# Patient Record
Sex: Male | Born: 1970 | Race: Black or African American | Hispanic: No | Marital: Married | Smoking: Never smoker
Health system: Southern US, Community
[De-identification: ages and names within clinical notes are randomized; demographics above are authoritative.]

---

## 1993-06-19 HISTORY — PX: ANKLE ARTHROPLASTY: SUR68

## 2015-12-30 DIAGNOSIS — L209 Atopic dermatitis, unspecified: Secondary | ICD-10-CM | POA: Diagnosis not present

## 2015-12-30 DIAGNOSIS — T7849XA Other allergy, initial encounter: Secondary | ICD-10-CM | POA: Diagnosis not present

## 2015-12-30 DIAGNOSIS — L509 Urticaria, unspecified: Secondary | ICD-10-CM | POA: Diagnosis not present

## 2016-01-27 DIAGNOSIS — L509 Urticaria, unspecified: Secondary | ICD-10-CM | POA: Diagnosis not present

## 2016-01-27 DIAGNOSIS — T7849XA Other allergy, initial encounter: Secondary | ICD-10-CM | POA: Diagnosis not present

## 2016-01-27 DIAGNOSIS — L209 Atopic dermatitis, unspecified: Secondary | ICD-10-CM | POA: Diagnosis not present

## 2016-02-04 DIAGNOSIS — R21 Rash and other nonspecific skin eruption: Secondary | ICD-10-CM | POA: Diagnosis not present

## 2016-02-04 DIAGNOSIS — L509 Urticaria, unspecified: Secondary | ICD-10-CM | POA: Diagnosis not present

## 2016-02-15 DIAGNOSIS — H40013 Open angle with borderline findings, low risk, bilateral: Secondary | ICD-10-CM | POA: Diagnosis not present

## 2016-02-16 DIAGNOSIS — J3081 Allergic rhinitis due to animal (cat) (dog) hair and dander: Secondary | ICD-10-CM | POA: Diagnosis not present

## 2016-02-16 DIAGNOSIS — J3089 Other allergic rhinitis: Secondary | ICD-10-CM | POA: Diagnosis not present

## 2016-02-16 DIAGNOSIS — L2089 Other atopic dermatitis: Secondary | ICD-10-CM | POA: Diagnosis not present

## 2016-02-16 DIAGNOSIS — J301 Allergic rhinitis due to pollen: Secondary | ICD-10-CM | POA: Diagnosis not present

## 2016-03-08 DIAGNOSIS — H401212 Low-tension glaucoma, right eye, moderate stage: Secondary | ICD-10-CM | POA: Diagnosis not present

## 2016-03-08 DIAGNOSIS — H401221 Low-tension glaucoma, left eye, mild stage: Secondary | ICD-10-CM | POA: Diagnosis not present

## 2016-04-15 DIAGNOSIS — Z23 Encounter for immunization: Secondary | ICD-10-CM | POA: Diagnosis not present

## 2016-06-30 DIAGNOSIS — Z0000 Encounter for general adult medical examination without abnormal findings: Secondary | ICD-10-CM | POA: Diagnosis not present

## 2016-06-30 DIAGNOSIS — Z125 Encounter for screening for malignant neoplasm of prostate: Secondary | ICD-10-CM | POA: Diagnosis not present

## 2016-06-30 DIAGNOSIS — E559 Vitamin D deficiency, unspecified: Secondary | ICD-10-CM | POA: Diagnosis not present

## 2016-06-30 DIAGNOSIS — E784 Other hyperlipidemia: Secondary | ICD-10-CM | POA: Diagnosis not present

## 2016-07-21 DIAGNOSIS — Z1389 Encounter for screening for other disorder: Secondary | ICD-10-CM | POA: Diagnosis not present

## 2016-07-21 DIAGNOSIS — Z0000 Encounter for general adult medical examination without abnormal findings: Secondary | ICD-10-CM | POA: Diagnosis not present

## 2016-07-21 DIAGNOSIS — E559 Vitamin D deficiency, unspecified: Secondary | ICD-10-CM | POA: Diagnosis not present

## 2016-07-21 DIAGNOSIS — D72819 Decreased white blood cell count, unspecified: Secondary | ICD-10-CM | POA: Diagnosis not present

## 2016-07-21 DIAGNOSIS — K648 Other hemorrhoids: Secondary | ICD-10-CM | POA: Diagnosis not present

## 2016-07-21 DIAGNOSIS — R21 Rash and other nonspecific skin eruption: Secondary | ICD-10-CM | POA: Diagnosis not present

## 2016-09-18 DIAGNOSIS — H401221 Low-tension glaucoma, left eye, mild stage: Secondary | ICD-10-CM | POA: Diagnosis not present

## 2016-09-18 DIAGNOSIS — H401212 Low-tension glaucoma, right eye, moderate stage: Secondary | ICD-10-CM | POA: Diagnosis not present

## 2016-10-10 DIAGNOSIS — H401212 Low-tension glaucoma, right eye, moderate stage: Secondary | ICD-10-CM | POA: Diagnosis not present

## 2016-10-10 DIAGNOSIS — H401221 Low-tension glaucoma, left eye, mild stage: Secondary | ICD-10-CM | POA: Diagnosis not present

## 2016-11-06 DIAGNOSIS — H4720 Unspecified optic atrophy: Secondary | ICD-10-CM | POA: Diagnosis not present

## 2016-11-28 DIAGNOSIS — H401212 Low-tension glaucoma, right eye, moderate stage: Secondary | ICD-10-CM | POA: Diagnosis not present

## 2017-02-26 DIAGNOSIS — H401212 Low-tension glaucoma, right eye, moderate stage: Secondary | ICD-10-CM | POA: Diagnosis not present

## 2017-02-26 DIAGNOSIS — H401221 Low-tension glaucoma, left eye, mild stage: Secondary | ICD-10-CM | POA: Diagnosis not present

## 2017-02-26 DIAGNOSIS — H10022 Other mucopurulent conjunctivitis, left eye: Secondary | ICD-10-CM | POA: Diagnosis not present

## 2017-03-17 DIAGNOSIS — Z23 Encounter for immunization: Secondary | ICD-10-CM | POA: Diagnosis not present

## 2017-04-03 DIAGNOSIS — H401221 Low-tension glaucoma, left eye, mild stage: Secondary | ICD-10-CM | POA: Diagnosis not present

## 2017-04-03 DIAGNOSIS — H401212 Low-tension glaucoma, right eye, moderate stage: Secondary | ICD-10-CM | POA: Diagnosis not present

## 2017-06-14 DIAGNOSIS — R600 Localized edema: Secondary | ICD-10-CM | POA: Diagnosis not present

## 2017-06-14 DIAGNOSIS — M79605 Pain in left leg: Secondary | ICD-10-CM | POA: Diagnosis not present

## 2017-06-14 DIAGNOSIS — Z6828 Body mass index (BMI) 28.0-28.9, adult: Secondary | ICD-10-CM | POA: Diagnosis not present

## 2017-06-20 ENCOUNTER — Ambulatory Visit (HOSPITAL_COMMUNITY)
Admission: RE | Admit: 2017-06-20 | Discharge: 2017-06-20 | Disposition: A | Discharge status: Home / Self Care | Payer: BLUE CROSS/BLUE SHIELD | Source: Ambulatory Visit | Attending: Vascular Surgery | Admitting: Vascular Surgery

## 2017-06-20 ENCOUNTER — Other Ambulatory Visit (HOSPITAL_COMMUNITY): Payer: Self-pay | Admitting: Internal Medicine

## 2017-06-20 DIAGNOSIS — M79605 Pain in left leg: Secondary | ICD-10-CM | POA: Diagnosis not present

## 2017-07-23 DIAGNOSIS — E559 Vitamin D deficiency, unspecified: Secondary | ICD-10-CM | POA: Diagnosis not present

## 2017-07-23 DIAGNOSIS — R82998 Other abnormal findings in urine: Secondary | ICD-10-CM | POA: Diagnosis not present

## 2017-07-23 DIAGNOSIS — Z125 Encounter for screening for malignant neoplasm of prostate: Secondary | ICD-10-CM | POA: Diagnosis not present

## 2017-07-23 DIAGNOSIS — E7849 Other hyperlipidemia: Secondary | ICD-10-CM | POA: Diagnosis not present

## 2017-07-24 DIAGNOSIS — H401221 Low-tension glaucoma, left eye, mild stage: Secondary | ICD-10-CM | POA: Diagnosis not present

## 2017-07-24 DIAGNOSIS — H10022 Other mucopurulent conjunctivitis, left eye: Secondary | ICD-10-CM | POA: Diagnosis not present

## 2017-07-24 DIAGNOSIS — H401212 Low-tension glaucoma, right eye, moderate stage: Secondary | ICD-10-CM | POA: Diagnosis not present

## 2017-07-27 DIAGNOSIS — R21 Rash and other nonspecific skin eruption: Secondary | ICD-10-CM | POA: Diagnosis not present

## 2017-07-27 DIAGNOSIS — Z0000 Encounter for general adult medical examination without abnormal findings: Secondary | ICD-10-CM | POA: Diagnosis not present

## 2017-07-27 DIAGNOSIS — J302 Other seasonal allergic rhinitis: Secondary | ICD-10-CM | POA: Diagnosis not present

## 2017-07-27 DIAGNOSIS — Z1331 Encounter for screening for depression: Secondary | ICD-10-CM | POA: Diagnosis not present

## 2017-07-27 DIAGNOSIS — R7309 Other abnormal glucose: Secondary | ICD-10-CM | POA: Diagnosis not present

## 2017-07-27 DIAGNOSIS — E7849 Other hyperlipidemia: Secondary | ICD-10-CM | POA: Diagnosis not present

## 2017-07-30 ENCOUNTER — Other Ambulatory Visit: Payer: Self-pay | Admitting: Internal Medicine

## 2017-07-30 DIAGNOSIS — E785 Hyperlipidemia, unspecified: Secondary | ICD-10-CM

## 2017-08-15 ENCOUNTER — Ambulatory Visit
Admission: RE | Admit: 2017-08-15 | Discharge: 2017-08-15 | Disposition: A | Discharge status: Home / Self Care | Payer: BLUE CROSS/BLUE SHIELD | Source: Ambulatory Visit | Attending: Internal Medicine | Admitting: Internal Medicine

## 2017-08-15 DIAGNOSIS — E785 Hyperlipidemia, unspecified: Secondary | ICD-10-CM

## 2017-08-30 DIAGNOSIS — Z1159 Encounter for screening for other viral diseases: Secondary | ICD-10-CM | POA: Diagnosis not present

## 2017-11-19 DIAGNOSIS — M9903 Segmental and somatic dysfunction of lumbar region: Secondary | ICD-10-CM | POA: Diagnosis not present

## 2017-11-19 DIAGNOSIS — S39012A Strain of muscle, fascia and tendon of lower back, initial encounter: Secondary | ICD-10-CM | POA: Diagnosis not present

## 2017-11-19 DIAGNOSIS — M62830 Muscle spasm of back: Secondary | ICD-10-CM | POA: Diagnosis not present

## 2017-12-19 DIAGNOSIS — H10022 Other mucopurulent conjunctivitis, left eye: Secondary | ICD-10-CM | POA: Diagnosis not present

## 2017-12-19 DIAGNOSIS — H401212 Low-tension glaucoma, right eye, moderate stage: Secondary | ICD-10-CM | POA: Diagnosis not present

## 2017-12-19 DIAGNOSIS — H401221 Low-tension glaucoma, left eye, mild stage: Secondary | ICD-10-CM | POA: Diagnosis not present

## 2017-12-21 DIAGNOSIS — M545 Low back pain: Secondary | ICD-10-CM | POA: Diagnosis not present

## 2018-02-15 ENCOUNTER — Other Ambulatory Visit: Payer: Self-pay | Admitting: Internal Medicine

## 2018-02-15 DIAGNOSIS — J9859 Other diseases of mediastinum, not elsewhere classified: Secondary | ICD-10-CM

## 2018-03-08 ENCOUNTER — Other Ambulatory Visit: Payer: BLUE CROSS/BLUE SHIELD

## 2018-03-11 DIAGNOSIS — M25561 Pain in right knee: Secondary | ICD-10-CM | POA: Diagnosis not present

## 2018-03-20 ENCOUNTER — Ambulatory Visit
Admission: RE | Admit: 2018-03-20 | Discharge: 2018-03-20 | Disposition: A | Discharge status: Home / Self Care | Payer: BLUE CROSS/BLUE SHIELD | Source: Ambulatory Visit | Attending: Internal Medicine | Admitting: Internal Medicine

## 2018-03-20 DIAGNOSIS — J9859 Other diseases of mediastinum, not elsewhere classified: Secondary | ICD-10-CM

## 2018-03-20 DIAGNOSIS — E320 Persistent hyperplasia of thymus: Secondary | ICD-10-CM | POA: Diagnosis not present

## 2018-03-26 DIAGNOSIS — R222 Localized swelling, mass and lump, trunk: Secondary | ICD-10-CM | POA: Diagnosis not present

## 2018-03-26 DIAGNOSIS — Z23 Encounter for immunization: Secondary | ICD-10-CM | POA: Diagnosis not present

## 2018-03-26 DIAGNOSIS — Z111 Encounter for screening for respiratory tuberculosis: Secondary | ICD-10-CM | POA: Diagnosis not present

## 2018-03-26 DIAGNOSIS — J302 Other seasonal allergic rhinitis: Secondary | ICD-10-CM | POA: Diagnosis not present

## 2018-03-26 DIAGNOSIS — R7309 Other abnormal glucose: Secondary | ICD-10-CM | POA: Diagnosis not present

## 2018-03-26 DIAGNOSIS — E7849 Other hyperlipidemia: Secondary | ICD-10-CM | POA: Diagnosis not present

## 2018-04-23 DIAGNOSIS — H401221 Low-tension glaucoma, left eye, mild stage: Secondary | ICD-10-CM | POA: Diagnosis not present

## 2018-04-23 DIAGNOSIS — H401212 Low-tension glaucoma, right eye, moderate stage: Secondary | ICD-10-CM | POA: Diagnosis not present

## 2018-05-07 DIAGNOSIS — H401212 Low-tension glaucoma, right eye, moderate stage: Secondary | ICD-10-CM | POA: Diagnosis not present

## 2018-05-07 DIAGNOSIS — H401221 Low-tension glaucoma, left eye, mild stage: Secondary | ICD-10-CM | POA: Diagnosis not present

## 2018-05-07 DIAGNOSIS — H10022 Other mucopurulent conjunctivitis, left eye: Secondary | ICD-10-CM | POA: Diagnosis not present

## 2018-05-23 DIAGNOSIS — H40033 Anatomical narrow angle, bilateral: Secondary | ICD-10-CM | POA: Diagnosis not present

## 2018-06-25 DIAGNOSIS — H401221 Low-tension glaucoma, left eye, mild stage: Secondary | ICD-10-CM | POA: Diagnosis not present

## 2018-06-25 DIAGNOSIS — H401212 Low-tension glaucoma, right eye, moderate stage: Secondary | ICD-10-CM | POA: Diagnosis not present

## 2018-06-25 DIAGNOSIS — H10022 Other mucopurulent conjunctivitis, left eye: Secondary | ICD-10-CM | POA: Diagnosis not present

## 2018-07-19 DIAGNOSIS — Z1211 Encounter for screening for malignant neoplasm of colon: Secondary | ICD-10-CM | POA: Diagnosis not present

## 2018-07-19 DIAGNOSIS — K640 First degree hemorrhoids: Secondary | ICD-10-CM | POA: Diagnosis not present

## 2018-07-19 DIAGNOSIS — D125 Benign neoplasm of sigmoid colon: Secondary | ICD-10-CM | POA: Diagnosis not present

## 2018-07-23 DIAGNOSIS — Z1211 Encounter for screening for malignant neoplasm of colon: Secondary | ICD-10-CM | POA: Diagnosis not present

## 2018-07-23 DIAGNOSIS — D125 Benign neoplasm of sigmoid colon: Secondary | ICD-10-CM | POA: Diagnosis not present

## 2018-08-06 DIAGNOSIS — H401212 Low-tension glaucoma, right eye, moderate stage: Secondary | ICD-10-CM | POA: Diagnosis not present

## 2018-08-06 DIAGNOSIS — H401221 Low-tension glaucoma, left eye, mild stage: Secondary | ICD-10-CM | POA: Diagnosis not present

## 2018-10-15 DIAGNOSIS — R222 Localized swelling, mass and lump, trunk: Secondary | ICD-10-CM | POA: Diagnosis not present

## 2018-10-15 DIAGNOSIS — H40009 Preglaucoma, unspecified, unspecified eye: Secondary | ICD-10-CM | POA: Diagnosis not present

## 2018-10-15 DIAGNOSIS — Z0000 Encounter for general adult medical examination without abnormal findings: Secondary | ICD-10-CM | POA: Diagnosis not present

## 2018-10-15 DIAGNOSIS — K635 Polyp of colon: Secondary | ICD-10-CM | POA: Diagnosis not present

## 2018-10-15 DIAGNOSIS — R739 Hyperglycemia, unspecified: Secondary | ICD-10-CM | POA: Diagnosis not present

## 2018-10-22 DIAGNOSIS — Z125 Encounter for screening for malignant neoplasm of prostate: Secondary | ICD-10-CM | POA: Diagnosis not present

## 2018-10-22 DIAGNOSIS — R739 Hyperglycemia, unspecified: Secondary | ICD-10-CM | POA: Diagnosis not present

## 2018-10-22 DIAGNOSIS — E7849 Other hyperlipidemia: Secondary | ICD-10-CM | POA: Diagnosis not present

## 2018-10-22 DIAGNOSIS — E559 Vitamin D deficiency, unspecified: Secondary | ICD-10-CM | POA: Diagnosis not present

## 2018-11-22 DIAGNOSIS — H401232 Low-tension glaucoma, bilateral, moderate stage: Secondary | ICD-10-CM | POA: Diagnosis not present

## 2018-12-17 DIAGNOSIS — H401212 Low-tension glaucoma, right eye, moderate stage: Secondary | ICD-10-CM | POA: Diagnosis not present

## 2018-12-17 DIAGNOSIS — H401221 Low-tension glaucoma, left eye, mild stage: Secondary | ICD-10-CM | POA: Diagnosis not present

## 2018-12-19 DIAGNOSIS — H401213 Low-tension glaucoma, right eye, severe stage: Secondary | ICD-10-CM | POA: Diagnosis not present

## 2018-12-19 DIAGNOSIS — H401222 Low-tension glaucoma, left eye, moderate stage: Secondary | ICD-10-CM | POA: Diagnosis not present

## 2019-01-01 ENCOUNTER — Other Ambulatory Visit: Payer: Self-pay | Admitting: Optometry

## 2019-01-01 DIAGNOSIS — H47213 Primary optic atrophy, bilateral: Secondary | ICD-10-CM

## 2019-01-20 ENCOUNTER — Other Ambulatory Visit: Payer: Self-pay

## 2019-01-20 DIAGNOSIS — Z20822 Contact with and (suspected) exposure to covid-19: Secondary | ICD-10-CM

## 2019-01-20 DIAGNOSIS — R6889 Other general symptoms and signs: Secondary | ICD-10-CM | POA: Diagnosis not present

## 2019-01-21 LAB — NOVEL CORONAVIRUS, NAA: SARS-CoV-2, NAA: NOT DETECTED

## 2019-01-23 DIAGNOSIS — Z03818 Encounter for observation for suspected exposure to other biological agents ruled out: Secondary | ICD-10-CM | POA: Diagnosis not present

## 2019-01-23 DIAGNOSIS — R05 Cough: Secondary | ICD-10-CM | POA: Diagnosis not present

## 2019-01-27 ENCOUNTER — Ambulatory Visit
Admission: RE | Admit: 2019-01-27 | Discharge: 2019-01-27 | Disposition: A | Discharge status: Home / Self Care | Payer: BLUE CROSS/BLUE SHIELD | Source: Ambulatory Visit | Attending: Optometry | Admitting: Optometry

## 2019-01-27 ENCOUNTER — Other Ambulatory Visit: Payer: Self-pay

## 2019-01-27 DIAGNOSIS — H47213 Primary optic atrophy, bilateral: Secondary | ICD-10-CM

## 2019-01-27 DIAGNOSIS — H47293 Other optic atrophy, bilateral: Secondary | ICD-10-CM | POA: Diagnosis not present

## 2019-01-27 MED ORDER — GADOBENATE DIMEGLUMINE 529 MG/ML IV SOLN
14.0000 mL | Freq: Once | INTRAVENOUS | Status: AC | PRN
  Administered 2019-01-27: 14 mL via INTRAVENOUS

## 2019-02-14 DIAGNOSIS — Z23 Encounter for immunization: Secondary | ICD-10-CM | POA: Diagnosis not present

## 2019-02-25 ENCOUNTER — Other Ambulatory Visit: Payer: Self-pay

## 2019-02-25 ENCOUNTER — Inpatient Hospital Stay (HOSPITAL_COMMUNITY)
Admission: EM | Admit: 2019-02-25 | Discharge: 2019-02-26 | DRG: 066 | Disposition: A | Discharge status: Home / Self Care | Payer: BC Managed Care – PPO | Attending: Neurosurgery | Admitting: Neurosurgery

## 2019-02-25 ENCOUNTER — Emergency Department (HOSPITAL_COMMUNITY): Payer: BC Managed Care – PPO

## 2019-02-25 ENCOUNTER — Encounter (HOSPITAL_COMMUNITY): Payer: Self-pay | Admitting: Emergency Medicine

## 2019-02-25 DIAGNOSIS — I619 Nontraumatic intracerebral hemorrhage, unspecified: Secondary | ICD-10-CM | POA: Diagnosis not present

## 2019-02-25 DIAGNOSIS — Z981 Arthrodesis status: Secondary | ICD-10-CM | POA: Diagnosis not present

## 2019-02-25 DIAGNOSIS — I615 Nontraumatic intracerebral hemorrhage, intraventricular: Secondary | ICD-10-CM | POA: Diagnosis not present

## 2019-02-25 DIAGNOSIS — Z20828 Contact with and (suspected) exposure to other viral communicable diseases: Secondary | ICD-10-CM | POA: Diagnosis present

## 2019-02-25 DIAGNOSIS — Z91040 Latex allergy status: Secondary | ICD-10-CM

## 2019-02-25 DIAGNOSIS — I629 Nontraumatic intracranial hemorrhage, unspecified: Secondary | ICD-10-CM | POA: Diagnosis not present

## 2019-02-25 LAB — CBC WITH DIFFERENTIAL/PLATELET
Abs Immature Granulocytes: 0.01 10*3/uL (ref 0.00–0.07)
Basophils Absolute: 0 10*3/uL (ref 0.0–0.1)
Basophils Relative: 1 %
Eosinophils Absolute: 0 10*3/uL (ref 0.0–0.5)
Eosinophils Relative: 0 %
HCT: 42.4 % (ref 39.0–52.0)
Hemoglobin: 14.5 g/dL (ref 13.0–17.0)
Immature Granulocytes: 0 %
Lymphocytes Relative: 46 %
Lymphs Abs: 1.8 10*3/uL (ref 0.7–4.0)
MCH: 30 pg (ref 26.0–34.0)
MCHC: 34.2 g/dL (ref 30.0–36.0)
MCV: 87.6 fL (ref 80.0–100.0)
Monocytes Absolute: 0.3 10*3/uL (ref 0.1–1.0)
Monocytes Relative: 9 %
Neutro Abs: 1.7 10*3/uL (ref 1.7–7.7)
Neutrophils Relative %: 44 %
Platelets: 250 10*3/uL (ref 150–400)
RBC: 4.84 MIL/uL (ref 4.22–5.81)
RDW: 11.9 % (ref 11.5–15.5)
WBC: 3.9 10*3/uL — ABNORMAL LOW (ref 4.0–10.5)
nRBC: 0 % (ref 0.0–0.2)

## 2019-02-25 LAB — PROTIME-INR
INR: 1 (ref 0.8–1.2)
Prothrombin Time: 13.4 seconds (ref 11.4–15.2)

## 2019-02-25 LAB — BASIC METABOLIC PANEL
Anion gap: 9 (ref 5–15)
BUN: 11 mg/dL (ref 6–20)
CO2: 25 mmol/L (ref 22–32)
Calcium: 8.9 mg/dL (ref 8.9–10.3)
Chloride: 105 mmol/L (ref 98–111)
Creatinine, Ser: 1 mg/dL (ref 0.61–1.24)
GFR calc Af Amer: >60 mL/min (ref >60)
GFR calc non Af Amer: >60 mL/min (ref >60)
Glucose, Bld: 111 mg/dL — ABNORMAL HIGH (ref 70–99)
Potassium: 4 mmol/L (ref 3.5–5.1)
Sodium: 139 mmol/L (ref 135–145)

## 2019-02-25 LAB — MRSA PCR SCREENING: MRSA by PCR: NEGATIVE

## 2019-02-25 LAB — SARS CORONAVIRUS 2 (TAT 6-24 HRS): SARS Coronavirus 2: NEGATIVE

## 2019-02-25 MED ORDER — LABETALOL HCL 5 MG/ML IV SOLN: 20.0000 mg | Freq: Once | INTRAVENOUS | Status: DC

## 2019-02-25 MED ORDER — SODIUM CHLORIDE 0.9 % IV SOLN
INTRAVENOUS | Status: DC
  Administered 2019-02-26: 01:00:00 via INTRAVENOUS

## 2019-02-25 MED ORDER — ACETAMINOPHEN 160 MG/5ML PO SOLN: 650.0000 mg | ORAL | Status: DC | PRN

## 2019-02-25 MED ORDER — CLEVIDIPINE BUTYRATE 0.5 MG/ML IV EMUL: 0.0000 mg/h | INTRAVENOUS | Status: DC

## 2019-02-25 MED ORDER — IOHEXOL 350 MG/ML SOLN
100.0000 mL | Freq: Once | INTRAVENOUS | Status: AC | PRN
  Administered 2019-02-25: 100 mL via INTRAVENOUS

## 2019-02-25 MED ORDER — STROKE: EARLY STAGES OF RECOVERY BOOK
Freq: Once | Status: AC
  Administered 2019-02-26: 07:00:00
  Filled 2019-02-25: qty 1

## 2019-02-25 MED ORDER — PANTOPRAZOLE SODIUM 40 MG IV SOLR
40.0000 mg | Freq: Every day | INTRAVENOUS | Status: DC
  Administered 2019-02-25: 40 mg via INTRAVENOUS
  Filled 2019-02-25: qty 40

## 2019-02-25 MED ORDER — SENNOSIDES-DOCUSATE SODIUM 8.6-50 MG PO TABS: 1.0000 | ORAL_TABLET | Freq: Two times a day (BID) | ORAL | Status: DC

## 2019-02-25 MED ORDER — ACETAMINOPHEN 325 MG PO TABS: 650.0000 mg | ORAL_TABLET | ORAL | Status: DC | PRN

## 2019-02-25 MED ORDER — BRIMONIDINE TARTRATE-TIMOLOL 0.2-0.5 % OP SOLN
1.0000 [drp] | Freq: Two times a day (BID) | OPHTHALMIC | Status: DC
  Filled 2019-02-25: qty 5

## 2019-02-25 MED ORDER — ACETAMINOPHEN 650 MG RE SUPP: 650.0000 mg | RECTAL | Status: DC | PRN

## 2019-02-25 MED ORDER — CHLORHEXIDINE GLUCONATE CLOTH 2 % EX PADS: 6.0000 | MEDICATED_PAD | Freq: Every day | CUTANEOUS | Status: DC

## 2019-02-25 MED ORDER — LATANOPROST 0.005 % OP SOLN
1.0000 [drp] | Freq: Every day | OPHTHALMIC | Status: DC
  Administered 2019-02-25: 23:00:00 1 [drp] via OPHTHALMIC
  Filled 2019-02-25: qty 2.5

## 2019-02-25 NOTE — H&P (Signed)
Thomas Reed is an 48 y.o. male.   Chief Complaint: Headache HPI: 48 year old male presents with sudden onset of headache.  Patient symptoms began while weightlifting earlier today.  No prior history of headache.  No prior history of severe headache.  Symptoms have rapidly improved.  Patient currently with minimal headache.  No other associated symptoms.  Patient denies numbness, paresthesias, weakness, cranial neuropathy, speech or language problem, or loss of consciousness.  No history of trauma.  History reviewed. No pertinent past medical history.  Past Surgical History:  Procedure Laterality Date  . ANKLE ARTHROPLASTY Left 1995    No family history on file. Social History:  reports that he has never smoked. He has never used smokeless tobacco. He reports current alcohol use. He reports that he does not use drugs.  Allergies:  Allergies  Allergen Reactions  . Latex Hives    (Not in a hospital admission)   Results for orders placed or performed during the hospital encounter of 02/25/19 (from the past 48 hour(s))  CBC with Differential     Status: Abnormal   Collection Time: 02/25/19  9:24 AM  Result Value Ref Range   WBC 3.9 (L) 4.0 - 10.5 K/uL   RBC 4.84 4.22 - 5.81 MIL/uL   Hemoglobin 14.5 13.0 - 17.0 g/dL   HCT 42.4 39.0 - 52.0 %   MCV 87.6 80.0 - 100.0 fL   MCH 30.0 26.0 - 34.0 pg   MCHC 34.2 30.0 - 36.0 g/dL   RDW 11.9 11.5 - 15.5 %   Platelets 250 150 - 400 K/uL   nRBC 0.0 0.0 - 0.2 %   Neutrophils Relative % 44 %   Neutro Abs 1.7 1.7 - 7.7 K/uL   Lymphocytes Relative 46 %   Lymphs Abs 1.8 0.7 - 4.0 K/uL   Monocytes Relative 9 %   Monocytes Absolute 0.3 0.1 - 1.0 K/uL   Eosinophils Relative 0 %   Eosinophils Absolute 0.0 0.0 - 0.5 K/uL   Basophils Relative 1 %   Basophils Absolute 0.0 0.0 - 0.1 K/uL   Immature Granulocytes 0 %   Abs Immature Granulocytes 0.01 0.00 - 0.07 K/uL    Comment: Performed at [facility], [address]  [place], [place] [address]  Basic metabolic panel     Status: Abnormal   Collection Time: 02/25/19  9:24 AM  Result Value Ref Range   Sodium 139 135 - 145 mmol/L   Potassium 4.0 3.5 - 5.1 mmol/L   Chloride 105 98 - 111 mmol/L   CO2 25 22 - 32 mmol/L   Glucose, Bld 111 (H) 70 - 99 mg/dL   BUN 11 6 - 20 mg/dL   Creatinine, Ser 1.00 0.61 - 1.24 mg/dL   Calcium 8.9 8.9 - 10.3 mg/dL   GFR calc non Af Amer >60 >60 mL/min   GFR calc Af Amer >60 >60 mL/min   Anion gap 9 5 - 15    Comment: Performed at [facility] [address] [place], [place] [address]  Protime-INR     Status: None   Collection Time: 02/25/19  9:24 AM  Result Value Ref Range   Prothrombin Time 13.4 11.4 - 15.2 seconds   INR 1.0 0.8 - 1.2    Comment: (NOTE) INR goal varies based on device and disease states. Performed at [facility], [place] [address] [place], [place] [address]    Ct Angio Head W Or Wo Contrast  Result  Date: 02/25/2019 CLINICAL DATA:  Sudden onset headache. EXAM: CT ANGIOGRAPHY HEAD TECHNIQUE: Multidetector CT imaging of the head was performed using the standard protocol during bolus administration of intravenous contrast. Multiplanar CT image reconstructions and MIPs were obtained to evaluate the vascular anatomy. CONTRAST:  75mL OMNIPAQUE IOHEXOL 350 MG/ML SOLN COMPARISON:  MRI head 01/27/2019 FINDINGS: CT HEAD Brain: Hyperdensity along the roof of the third ventricle consistent with acute hemorrhage. Relatively small hemorrhage volume. No other ventricular blood. No subarachnoid hip blood. Ventricle size normal. Negative for acute ischemic infarct. No mass or edema or midline shift. Vascular: Negative for hyperdense vessel Skull: Negative Sinuses: Air-fluid level in the maxillary sinus bilaterally with associated mucosal edema. Orbits: Negative CTA HEAD Anterior circulation: Cavernous carotid normal bilaterally without stenosis or aneurysm. Anterior and middle cerebral arteries patent bilaterally.  Posterior circulation: Both vertebral arteries patent to the basilar without stenosis. PICA patent bilaterally. Basilar widely patent. AICA, superior cerebellar, and posterior cerebral arteries are patent without stenosis. Negative for aneurysm. Venous sinuses: Negative for venous sinus thrombosis Hemorrhage in the roof of the third ventricle is in the region of the internal cerebral veins which appear patent bilaterally. Straight sinus patent. Anatomic variants: None IMPRESSION: Small volume acute intraventricular hemorrhage in the roof of the third ventricle. Suspect small vascular malformation however no aneurysm or AVM is identified. No evidence of venous sinus thrombosis. Colloid cyst can hemorrhage into the third ventricle however no colloid cyst identified. No hydrocephalus or acute infarct. These results were called by telephone at the time of interpretation on 02/25/2019 at 12:54 pm to provider [facility] , who verbally acknowledged these results. Electronically Signed   By: Marlan Palauharles  Clark M.D.   On: 02/25/2019 12:54    Pertinent items noted in HPI and remainder of comprehensive ROS otherwise negative.  Blood pressure (!) 128/91, pulse 65, temperature 97.7 F (36.5 C), temperature source Oral, resp. rate 16, height 5\' 4"  (1.626 m), weight 77.1 kg, SpO2 100 %.  Patient is awake and alert.  He is oriented and appropriate.  Speech is fluent.  His judgment and insight are intact.  Cranial nerve function normal bilaterally.  Motor examination 5/5 bilaterally.  No pronator drift.  Sensory exam intact.  Deep tendon is normal active.  No evidence of long track signs.  Initial head ears eyes nose throat is unremarkable.  Chest and abdomen are benign.  Extremities are free from injury or deformity. Assessment/Plan Small atypical third ventricular hemorrhage.  CT angiogram and MRI scan negative for vascular malformation or aneurysm.  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  Plan ICU  admission with observation.  Follow-up in CT scan in morning.  Sherilyn CooterHenry A Sparrow Sanzo 02/25/2019, 2:36 PM

## 2019-02-25 NOTE — ED Triage Notes (Signed)
Pt here for evaluation of headache onset while working out this morning. Pt has no pain on arrival, pain lasted for less than 15 mins.

## 2019-02-25 NOTE — ED Provider Notes (Signed)
[facility] EMERGENCY DEPARTMENT Provider Note   CSN: [id number] Arrival date & time: 02/25/19  0806     History   Chief Complaint Chief Complaint  Patient presents with  . Headache    HPI Thomas Reed is a 48 y.o. male.     The history is provided by the patient and medical records. No language interpreter was used.  Headache Pain location:  L parietal and L temporal Quality:  Dull Severity currently:  0/10 Severity at highest:  0/10 Onset quality:  Sudden Duration:  3 hours Timing:  Constant Progression:  Resolved Chronicity:  New Similar to prior headaches: no   Context: activity and straining   Worsened by:  Nothing Ineffective treatments:  None tried Associated symptoms: no abdominal pain, no back pain, no blurred vision, no congestion, no cough, no diarrhea, no dizziness, no fatigue, no fever, no focal weakness, no loss of balance, no myalgias, no nausea, no neck pain, no neck stiffness, no numbness, no paresthesias, no photophobia, no seizures, no sore throat, no tingling, no visual change, no vomiting and no weakness   Risk factors: family hx of SAH     History reviewed. No pertinent past medical history.  There are no active problems to display for this patient.   Past Surgical History:  Procedure Laterality Date  . ANKLE ARTHROPLASTY Left 1995        Home Medications    Prior to Admission medications   Not on File    Family History No family history on file.  Social History Social History   Tobacco Use  . Smoking status: Never Smoker  . Smokeless tobacco: Never Used  Substance Use Topics  . Alcohol use: Yes    Comment: social drinker  . Drug use: Never     Allergies   Patient has no known allergies.   Review of Systems Review of Systems  Constitutional: Negative for chills, diaphoresis, fatigue and fever.  HENT: Negative for congestion and sore throat.   Eyes: Negative for blurred vision, photophobia and  visual disturbance.  Respiratory: Negative for cough, chest tightness, shortness of breath, wheezing and stridor.   Cardiovascular: Negative for chest pain and palpitations.  Gastrointestinal: Negative for abdominal pain, diarrhea, nausea and vomiting.  Musculoskeletal: Negative for back pain, myalgias, neck pain and neck stiffness.  Neurological: Positive for headaches. Negative for dizziness, focal weakness, seizures, facial asymmetry, weakness, light-headedness, numbness, paresthesias and loss of balance.  Psychiatric/Behavioral: Negative for agitation and confusion.  All other systems reviewed and are negative.    Physical Exam Updated Vital Signs BP 130/86 (BP Location: Left Arm)   Pulse 71   Temp 97.7 F (36.5 C) (Oral)   Resp 16   Ht 5\' 4"  (1.626 m)   Wt 77.1 kg   SpO2 96%   BMI 29.18 kg/m   Physical Exam Vitals signs and nursing note reviewed.  Constitutional:      General: He is not in acute distress.    Appearance: He is well-developed. He is not ill-appearing, toxic-appearing or diaphoretic.  HENT:     Head: Normocephalic and atraumatic.     Mouth/Throat:     Mouth: Mucous membranes are moist.     Pharynx: Oropharynx is clear.  Eyes:     General: No scleral icterus.    Extraocular Movements: Extraocular movements intact.     Conjunctiva/sclera: Conjunctivae normal.     Pupils: Pupils are equal, round, and reactive to light. Pupils are equal.  Neck:  Musculoskeletal: Normal range of motion and neck supple.  Cardiovascular:     Rate and Rhythm: Normal rate and regular rhythm.     Heart sounds: Normal heart sounds. No murmur.  Pulmonary:     Effort: Pulmonary effort is normal. No respiratory distress.     Breath sounds: Normal breath sounds. No wheezing, rhonchi or rales.  Chest:     Chest wall: No tenderness.  Abdominal:     Palpations: Abdomen is soft.     Tenderness: There is no abdominal tenderness.  Musculoskeletal:        General: No swelling.   Skin:    General: Skin is warm and dry.     Capillary Refill: Capillary refill takes less than 2 seconds.  Neurological:     Mental Status: He is alert and oriented to person, place, and time.     GCS: GCS eye subscore is 4. GCS verbal subscore is 5. GCS motor subscore is 6.     Cranial Nerves: No cranial nerve deficit, dysarthria or facial asymmetry.     Sensory: No sensory deficit.     Motor: No weakness.     Coordination: Romberg sign negative. Coordination normal.     Gait: Gait normal.  Psychiatric:        Mood and Affect: Mood normal.      ED Treatments / Results  Labs (all labs ordered are listed, but only abnormal results are displayed) Labs Reviewed  CBC WITH DIFFERENTIAL/PLATELET - Abnormal; Notable for the following components:      Result Value   WBC 3.9 (*)    All other components within normal limits  BASIC METABOLIC PANEL - Abnormal; Notable for the following components:   Glucose, Bld 111 (*)    All other components within normal limits  SARS CORONAVIRUS 2 (TAT 6-24 HRS)  PROTIME-INR  HIV ANTIBODY (ROUTINE TESTING W REFLEX)    EKG None  Radiology Ct Angio Head W Or Wo Contrast  Result Date: 02/25/2019 CLINICAL DATA:  Sudden onset headache. EXAM: CT ANGIOGRAPHY HEAD TECHNIQUE: Multidetector CT imaging of the head was performed using the standard protocol during bolus administration of intravenous contrast. Multiplanar CT image reconstructions and MIPs were obtained to evaluate the vascular anatomy. CONTRAST:  58mL OMNIPAQUE IOHEXOL 350 MG/ML SOLN COMPARISON:  MRI head 01/27/2019 FINDINGS: CT HEAD Brain: Hyperdensity along the roof of the third ventricle consistent with acute hemorrhage. Relatively small hemorrhage volume. No other ventricular blood. No subarachnoid hip blood. Ventricle size normal. Negative for acute ischemic infarct. No mass or edema or midline shift. Vascular: Negative for hyperdense vessel Skull: Negative Sinuses: Air-fluid level in the  maxillary sinus bilaterally with associated mucosal edema. Orbits: Negative CTA HEAD Anterior circulation: Cavernous carotid normal bilaterally without stenosis or aneurysm. Anterior and middle cerebral arteries patent bilaterally. Posterior circulation: Both vertebral arteries patent to the basilar without stenosis. PICA patent bilaterally. Basilar widely patent. AICA, superior cerebellar, and posterior cerebral arteries are patent without stenosis. Negative for aneurysm. Venous sinuses: Negative for venous sinus thrombosis Hemorrhage in the roof of the third ventricle is in the region of the internal cerebral veins which appear patent bilaterally. Straight sinus patent. Anatomic variants: None IMPRESSION: Small volume acute intraventricular hemorrhage in the roof of the third ventricle. Suspect small vascular malformation however no aneurysm or AVM is identified. No evidence of venous sinus thrombosis. Colloid cyst can hemorrhage into the third ventricle however no colloid cyst identified. No hydrocephalus or acute infarct. These results were called by telephone  at the time of interpretation on 02/25/2019 at 12:54 pm to provider [facility]  , who verbally acknowledged these results. Electronically Signed   By: Marlan Palauharles  Clark M.D.   On: 02/25/2019 12:54    Procedures Procedures (including critical care time)  CRITICAL CARE Performed by: Canary Brimhristopher J  Total critical care time: 35 minutes Critical care time was exclusive of separately billable procedures and treating other patients. Critical care was necessary to treat or prevent imminent or life-threatening deterioration. Critical care was time spent personally by me on the following activities: development of treatment plan with patient and/or surrogate as well as nursing, discussions with consultants, evaluation of patient's response to treatment, examination of patient, obtaining history from patient or surrogate, ordering and performing  treatments and interventions, ordering and review of laboratory studies, ordering and review of radiographic studies, pulse oximetry and re-evaluation of patient's condition.   Medications Ordered in ED Medications  brimonidine-timolol (COMBIGAN) 0.2-0.5 % ophthalmic solution 1 drop (has no administration in time range)  latanoprost (XALATAN) 0.005 % ophthalmic solution 1 drop (has no administration in time range)   stroke: mapping our early stages of recovery book (has no administration in time range)  acetaminophen (TYLENOL) tablet 650 mg (has no administration in time range)    Or  acetaminophen (TYLENOL) solution 650 mg (has no administration in time range)    Or  acetaminophen (TYLENOL) suppository 650 mg (has no administration in time range)  senna-docusate (Senokot-S) tablet 1 tablet (has no administration in time range)  pantoprazole (PROTONIX) injection 40 mg (has no administration in time range)  0.9 %  sodium chloride infusion (has no administration in time range)  labetalol (NORMODYNE) injection 20 mg (has no administration in time range)    And  clevidipine (CLEVIPREX) infusion 0.5 mg/mL (has no administration in time range)  iohexol (OMNIPAQUE) 350 MG/ML injection 100 mL (100 mLs Intravenous Contrast Given 02/25/19 1101)     Initial Impression / Assessment and Plan / ED Course  I have reviewed the triage vital signs and the nursing notes.  Pertinent labs & imaging results that were available during my care of the patient were reviewed by me and considered in my medical decision making (see chart for details).       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  Patient reports that he was exercising lifting weights this morning when he had sudden onset of severe left headache.  He has never had headaches like this before.  It was extremely intense initially and lasted for approximately 15 to 20 minutes.  He laid down and rested and his symptoms  improved.  He has never had headaches before.  He denies any nausea, vomiting, urinary symptoms or GI symptoms.  Denies any loss of bowel or bladder control.  Denied any seizure-like activity.  Denied any vision changes, numbness, tingling, or weakness of extremities.  90 difficulty with gait.  Denied any significant neck pain or neck stiffness.  He is very concerned because his mother had a subarachnoid hemorrhage in the past and he is concerned about this.  He reports his headache occurred about 530 or 6 AM.  On exam, no focal neurologic deficits.  Good gait.  Normal finger-nose-finger testing bilaterally.  Good strength and sensation in all extremities.  Good pulses in all extremities.  Symmetric smile, clear speech, and normal extraocular movements and pupils.  Lungs clear chest nontender.  Abdomen nontender.  Patient resting comfortably.  Had a shared  decision-making conversation with patient about his headache.  Patient is concerned given his family history of subarachnoid hemorrhage and intracranial hemorrhage.  As this headache was sudden onset and during exertion and heavy weightlifting, patient does not feel comfortable not getting imaging today.  Given his family history and high concern, patient will have a CTA of the head to look for aneurysm or intracranial hemorrhage.  If work-up is reassuring, anticipate discharge home with outpatient follow-up.  He is currently headache free during my evaluation.  CT returned showing evidence of intracranial hemorrhage in the third ventricle.  Neurosurgery was called who will admit patient to the ICU for further management and repeat CT scan.  Patient continues to have no headache during my management.  Patient admitted for further management.   Final Clinical Impressions(s) / ED Diagnoses   Final diagnoses:  IVH (intraventricular hemorrhage) (HCC)     Clinical Impression: 1. IVH (intraventricular hemorrhage) (HCC)     Disposition: Admit   This note was prepared with assistance of Dragon voice recognition software. Occasional wrong-word or sound-a-like substitutions may have occurred due to the inherent limitations of voice recognition software.     , Canary Brimhristopher J, MD 02/25/19 [phone]

## 2019-02-25 NOTE — ED Notes (Signed)
Patient transported to CT 

## 2019-02-25 NOTE — ED Notes (Signed)
ED TO INPATIENT HANDOFF REPORT  ED Nurse Name and Phone #:  Joni Reiningicole [phone]  S Name/Age/Gender Thomas Reed 48 y.o. male Room/Bed: 017C/017C  Code Status   Code Status: Full Code  Home/SNF/Other Home Patient oriented to: self, place, time and situation Is this baseline? Yes   Triage Complete: Triage complete  Chief Complaint headache  Triage Note Pt here for evaluation of headache onset while working out this morning. Pt has no pain on arrival, pain lasted for less than 15 mins.    Allergies Allergies  Allergen Reactions  . Latex Hives    Level of Care/Admitting Diagnosis ED Disposition    ED Disposition Condition Comment   Admit  [facility] [[id number]]  Level of Care: ICU [6]  Covid Evaluation: Asymptomatic Screening Protocol (No Symptoms)  Diagnosis: IVH (intraventricular hemorrhage) [facility](HCC) [[id number]][[id number]]  Admitting Physician: Julio SicksPOOL, HENRY [phone][[id number]]  Attending Physician: Julio SicksPOOL, HENRY [phone][[id number]]  Estimated length of stay: past midnight tomorrow  Certification:: I certify this patient will need inpatient services for at least 2 midnights  Bed request comments: 4n  PT Class (Do Not Modify): Inpatient [101]  PT Acc Code (Do Not Modify): Private [1]       B Medical/Surgery History History reviewed. No pertinent past medical history. Past Surgical History:  Procedure Laterality Date  . ANKLE ARTHROPLASTY Left 1995     A IV Location/Drains/Wounds Patient Lines/Drains/Airways Status   Active Line/Drains/Airways    Name:   Placement date:   Placement time:   Site:   Days:   Peripheral IV 02/25/19 Left Antecubital   02/25/19    0949    Antecubital   less than 1          Intake/Output Last 24 hours No intake or output data in the 24 hours ending 02/25/19 1721  Labs/Imaging Results for orders placed or performed during the hospital encounter of 02/25/19 (from the past 48 hour(s))  CBC with Differential     Status: Abnormal   Collection Time: 02/25/19  9:24 AM  Result Value Ref Range   WBC 3.9 (L) 4.0 - 10.5 K/uL   RBC 4.84 4.22 - 5.81 MIL/uL   Hemoglobin 14.5 13.0 - 17.0 g/dL   HCT 10.242.4 72.539.0 - 36.652.0 %   MCV 87.6 80.0 - 100.0 fL   MCH 30.0 26.0 - 34.0 pg   MCHC 34.2 30.0 - 36.0 g/dL   RDW 44.011.9 34.711.5 - 42.515.5 %   Platelets 250 150 - 400 K/uL   nRBC 0.0 0.0 - 0.2 %   Neutrophils Relative % 44 %   Neutro Abs 1.7 1.7 - 7.7 K/uL   Lymphocytes Relative 46 %   Lymphs Abs 1.8 0.7 - 4.0 K/uL   Monocytes Relative 9 %   Monocytes Absolute 0.3 0.1 - 1.0 K/uL   Eosinophils Relative 0 %   Eosinophils Absolute 0.0 0.0 - 0.5 K/uL   Basophils Relative 1 %   Basophils Absolute 0.0 0.0 - 0.1 K/uL   Immature Granulocytes 0 %   Abs Immature Granulocytes 0.01 0.00 - 0.07 K/uL    Comment: Performed at [facility], [address] [place], [place] [address]  Basic metabolic panel     Status: Abnormal   Collection Time: 02/25/19  9:24 AM  Result Value Ref Range   Sodium 139 135 - 145 mmol/L   Potassium 4.0 3.5 - 5.1 mmol/L   Chloride 105 98 - 111 mmol/L   CO2 25 22 - 32  mmol/L   Glucose, Bld 111 (H) 70 - 99 mg/dL   BUN 11 6 - 20 mg/dL   Creatinine, Ser 1.00 0.61 - 1.24 mg/dL   Calcium 8.9 8.9 - 10.3 mg/dL   GFR calc non Af Amer >60 >60 mL/min   GFR calc Af Amer >60 >60 mL/min   Anion gap 9 5 - 15    Comment: Performed at [facility] [address] [place], [place] [address]  Protime-INR     Status: None   Collection Time: 02/25/19  9:24 AM  Result Value Ref Range   Prothrombin Time 13.4 11.4 - 15.2 seconds   INR 1.0 0.8 - 1.2    Comment: (NOTE) INR goal varies based on device and disease states. Performed at [facility], [place] [address], [place], [place] [address]    Ct Angio Head W Or Wo Contrast  Result Date: 02/25/2019 CLINICAL DATA:  Sudden onset headache. EXAM: CT ANGIOGRAPHY HEAD TECHNIQUE: Multidetector CT imaging of the head was performed using the standard protocol during bolus  administration of intravenous contrast. Multiplanar CT image reconstructions and MIPs were obtained to evaluate the vascular anatomy. CONTRAST:  33mL OMNIPAQUE IOHEXOL 350 MG/ML SOLN COMPARISON:  MRI head 01/27/2019 FINDINGS: CT HEAD Brain: Hyperdensity along the roof of the third ventricle consistent with acute hemorrhage. Relatively small hemorrhage volume. No other ventricular blood. No subarachnoid hip blood. Ventricle size normal. Negative for acute ischemic infarct. No mass or edema or midline shift. Vascular: Negative for hyperdense vessel Skull: Negative Sinuses: Air-fluid level in the maxillary sinus bilaterally with associated mucosal edema. Orbits: Negative CTA HEAD Anterior circulation: Cavernous carotid normal bilaterally without stenosis or aneurysm. Anterior and middle cerebral arteries patent bilaterally. Posterior circulation: Both vertebral arteries patent to the basilar without stenosis. PICA patent bilaterally. Basilar widely patent. AICA, superior cerebellar, and posterior cerebral arteries are patent without stenosis. Negative for aneurysm. Venous sinuses: Negative for venous sinus thrombosis Hemorrhage in the roof of the third ventricle is in the region of the internal cerebral veins which appear patent bilaterally. Straight sinus patent. Anatomic variants: None IMPRESSION: Small volume acute intraventricular hemorrhage in the roof of the third ventricle. Suspect small vascular malformation however no aneurysm or AVM is identified. No evidence of venous sinus thrombosis. Colloid cyst can hemorrhage into the third ventricle however no colloid cyst identified. No hydrocephalus or acute infarct. These results were called by telephone at the time of interpretation on 02/25/2019 at 12:54 pm to provider [facility] , who verbally acknowledged these results. Electronically Signed   By: Franchot Gallo M.D.   On: 02/25/2019 12:54    Pending Labs Unresulted Labs (From admission, onward)     Start     Ordered   02/25/19 1447  SARS CORONAVIRUS 2 (TAT 6-24 HRS) Nasopharyngeal Nasopharyngeal Swab  (Asymptomatic/Tier 2)  Once,   STAT    Question Answer Comment  Is this test for diagnosis or screening Screening   Symptomatic for COVID-19 as defined by CDC No   Hospitalized for COVID-19 No   Admitted to ICU for COVID-19 No   Previously tested for COVID-19 Yes   Resident in a congregate (group) care setting No   Employed in healthcare setting Yes      02/25/19 1446   02/25/19 1441  HIV antibody (Routine Testing)  Once,   STAT     02/25/19 1442          Vitals/Pain Today's Vitals   02/25/19 1545 02/25/19 1601  02/25/19 1626 02/25/19 1700  BP: 132/81 (!) 153/79 124/86 129/89  Pulse: (!) 57 60 62 63  Resp: 18 (!) 22 11 18   Temp:      TempSrc:      SpO2: 98% 99% 99% 99%  Weight:      Height:      PainSc:        Isolation Precautions No active isolations  Medications Medications  brimonidine-timolol (COMBIGAN) 0.2-0.5 % ophthalmic solution 1 drop (has no administration in time range)  latanoprost (XALATAN) 0.005 % ophthalmic solution 1 drop (has no administration in time range)   stroke: mapping our early stages of recovery book (has no administration in time range)  acetaminophen (TYLENOL) tablet 650 mg (has no administration in time range)    Or  acetaminophen (TYLENOL) solution 650 mg (has no administration in time range)    Or  acetaminophen (TYLENOL) suppository 650 mg (has no administration in time range)  senna-docusate (Senokot-S) tablet 1 tablet (has no administration in time range)  pantoprazole (PROTONIX) injection 40 mg (has no administration in time range)  0.9 %  sodium chloride infusion (has no administration in time range)  labetalol (NORMODYNE) injection 20 mg (has no administration in time range)    And  clevidipine (CLEVIPREX) infusion 0.5 mg/mL (has no administration in time range)  iohexol (OMNIPAQUE) 350 MG/ML injection 100 mL (100 mLs  Intravenous Contrast Given 02/25/19 1101)    Mobility walks Low fall risk   Focused Assessments Neuro Assessment Handoff:  Swallow screen pass? Yes    NIH Stroke Scale ( + Modified Stroke Scale Criteria)  Interval: Initial Level of Consciousness (1a.)   : Alert, keenly responsive LOC Questions (1b. )   +: Answers both questions correctly LOC Commands (1c. )   + : Performs both tasks correctly Best Gaze (2. )  +: Normal Visual (3. )  +: No visual loss Facial Palsy (4. )    : Normal symmetrical movements Motor Arm, Left (5a. )   +: No drift Motor Arm, Right (5b. )   +: No drift Motor Leg, Left (6a. )   +: No drift Motor Leg, Right (6b. )   +: No drift Limb Ataxia (7. ): Absent Sensory (8. )   +: Normal, no sensory loss Best Language (9. )   +: No aphasia Dysarthria (10. ): Normal Extinction/Inattention (11.)   +: No Abnormality Modified SS Total  +: 0 Complete NIHSS TOTAL: 0     Neuro Assessment: Within Defined Limits Neuro Checks:   Initial (02/25/19 0930)  Last Documented NIHSS Modified Score: 0 (02/25/19 1500) Has TPA been given? No If patient is a Neuro Trauma and patient is going to OR before floor call report to 4N Charge nurse: [phone] or [phone]     R Recommendations: See Admitting Provider Note  Report given to:   Additional Notes:

## 2019-02-26 ENCOUNTER — Inpatient Hospital Stay (HOSPITAL_COMMUNITY): Payer: BC Managed Care – PPO

## 2019-02-26 LAB — HIV ANTIBODY (ROUTINE TESTING W REFLEX): HIV Screen 4th Generation wRfx: NONREACTIVE

## 2019-02-26 MED ORDER — PANTOPRAZOLE SODIUM 40 MG PO TBEC: 40.0000 mg | DELAYED_RELEASE_TABLET | Freq: Every day | ORAL | Status: DC

## 2019-02-26 NOTE — Discharge Summary (Signed)
Physician Discharge Summary  Patient ID: Thomas Reed MRN: [id number] DOB/AGE: Dec 13, 1970 48 y.o.  Admit date: 02/25/2019 Discharge date: 02/26/2019  Admission Diagnoses:  Discharge Diagnoses:  Active Problems:   IVH (intraventricular hemorrhage) [facility])   Discharged Condition: good  Hospital Course: Patient been to the hospital for evaluation of sudden onset headache.  A small amount of intraventricular hemorrhage was discovered on CT scan and MRI scan.  MRI scanning and CT NGO were negative for vascular malformation or aneurysm or neoplasm.  Patient's headache has resolved.  He is neurologically intact.  Follow-up head CT scan demonstrated no evidence of further hemorrhage or any evidence of hydrocephalus.  Plan is for discharge home with follow-up as an outpatient.  Consults:   Significant Diagnostic Studies:   Treatments:   Discharge Exam: Blood pressure 129/81, pulse (!) 56, temperature 98.1 F (36.7 C), temperature source Oral, resp. rate 14, height 5\' 4"  (1.626 m), weight 76.8 kg, SpO2 97 %. Awake and alert.  Oriented and appropriate.  Speech fluent.  Judgment insight intact.  Cranial nerve function normal bilateral.  Motor and sensory function extremities normal.  Chest and abdomen benign.  Disposition:    Allergies as of 02/26/2019      Reactions   Latex Hives      Medication List    TAKE these medications   Combigan 0.2-0.5 % ophthalmic solution Generic drug: brimonidine-timolol Place 1 drop into both eyes 2 (two) times daily.   Lumigan 0.01 % Soln Generic drug: bimatoprost Place 1 drop into both eyes daily.      Follow-up Information    Earnie Larsson, MD. Schedule an appointment as soon as possible for a visit in 1 week(s).   Specialty: Neurosurgery Contact information: [address] [place] [address] [phone]           Signed: Charlie Pitter 02/26/2019, 8:11 AM

## 2019-02-26 NOTE — Progress Notes (Signed)
Pt discharged home. Discharged instruction reviewed with patient.

## 2019-02-26 NOTE — Progress Notes (Signed)
PT Cancellation Note  Patient Details Name: Thomas Reed MRN: [id number] DOB: 1971/04/02   Cancelled Treatment:    Reason Eval/Treat Not Completed: PT screened, no needs identified, will sign off; spoke with pt who feels he is at his baseline without need for PT.  Encouraged slow return to activities.  Will sign off.    Reginia Naas 02/26/2019, 12:13 PM  Magda Kiel, [place] [phone] 02/26/2019

## 2019-02-26 NOTE — Progress Notes (Signed)
OT Cancellation Note  Patient Details Name: Thomas Reed MRN: [id number] DOB: 1970-12-05   Cancelled Treatment:    Reason Eval/Treat Not Completed: Patient at procedure or test/ unavailable; pt for repeat CT. Will follow up for OT eval as schedule permits.  Lou Cal, OT [facility] Pager [phone] Office [phone]   Raymondo Band 02/26/2019, 9:26 AM

## 2019-02-26 NOTE — Progress Notes (Signed)
Overall looks good this morning.  No headache.  Bright and alert.  Oriented and appropriate.  Speech fluent.  Motor and sensory exam intact.  Follow-up head CT scan not performed yet.  Overall doing very well.  Patient with small volume third ventricular hemorrhage without hydrocephalus or other complicating features.  Plan to get follow-up head CT scan today.  If stable patient will likely be discharged home.  He will maintain light activity level.  I will follow-up with him in a couple weeks.  We will get an outpatient follow-up MRI scan in 1 month.

## 2019-02-26 NOTE — Progress Notes (Signed)
OT Cancellation Note  Patient Details Name: ADYAN PALAU MRN: [id number] DOB: [birth year]   Cancelled Treatment:    Reason Eval/Treat Not Completed: OT screened, no needs identified, will sign off. Spoke with PT who reports pt at his baseline, no acute OT needs at this time. Acute OT to sign off. Thank you for this referral.  Lou Cal, OT [facility] Pager [phone] Office [phone]    Raymondo Band 02/26/2019, 1:04 PM

## 2019-03-19 DIAGNOSIS — H401221 Low-tension glaucoma, left eye, mild stage: Secondary | ICD-10-CM | POA: Diagnosis not present

## 2019-03-19 DIAGNOSIS — H401212 Low-tension glaucoma, right eye, moderate stage: Secondary | ICD-10-CM | POA: Diagnosis not present

## 2019-05-14 DIAGNOSIS — Z20828 Contact with and (suspected) exposure to other viral communicable diseases: Secondary | ICD-10-CM | POA: Diagnosis not present

## 2019-05-14 DIAGNOSIS — Z6827 Body mass index (BMI) 27.0-27.9, adult: Secondary | ICD-10-CM | POA: Diagnosis not present

## 2020-06-17 IMAGING — CT CT HEAD W/O CM
4 series · 16 of 47 positions shown, 18 images · non-contrast
Comparison: Head CT from yesterday

CLINICAL DATA: Follow-up intracranial hemorrhage

EXAM:
CT HEAD WITHOUT CONTRAST
TECHNIQUE: Contiguous axial images were obtained from the base of the skull
through the vertex without intravenous contrast.

[Series 3: head without · axial · non-contrast · 0.42mm/px · z∈[-76,+44]mm · 7 of 32 slices shown, 9 images]
[im 4/32  brain]
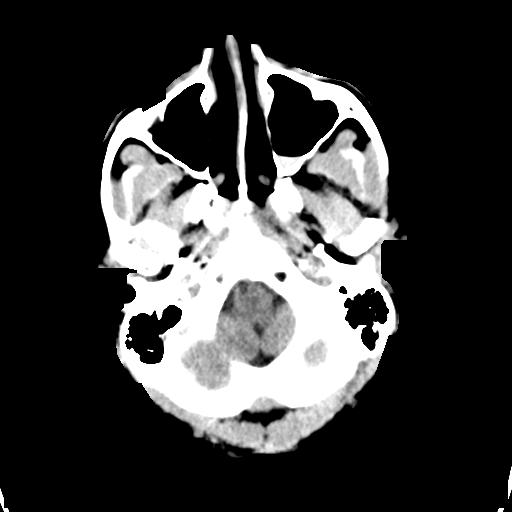
[im 4/32  bone]
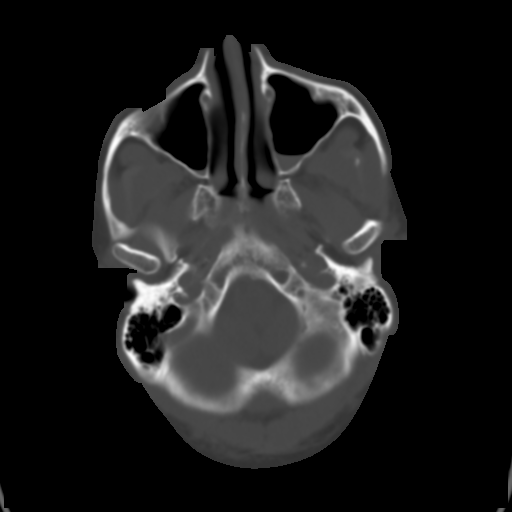
[im 8/32  brain]
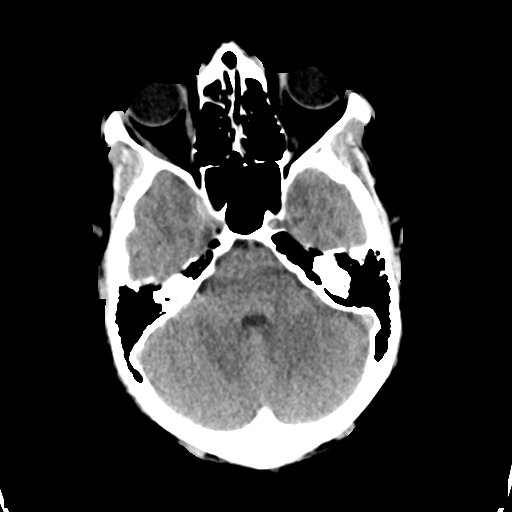
[im 12/32  brain]
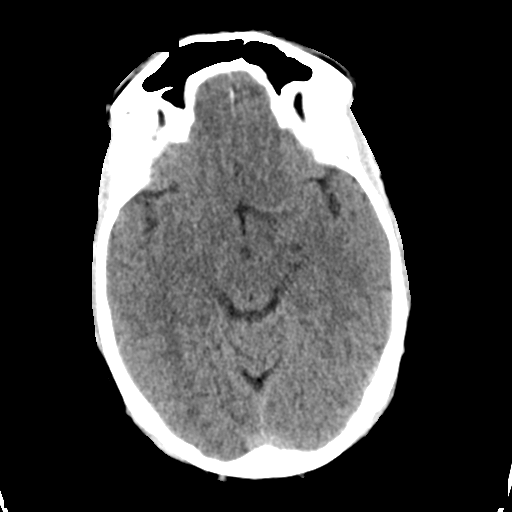
[im 16/32  brain]
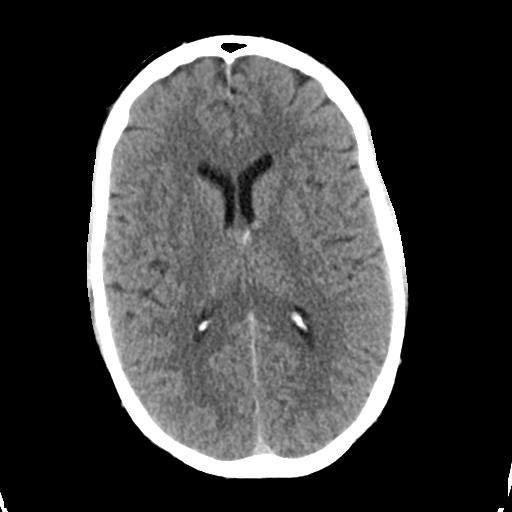
[im 20/32  brain]
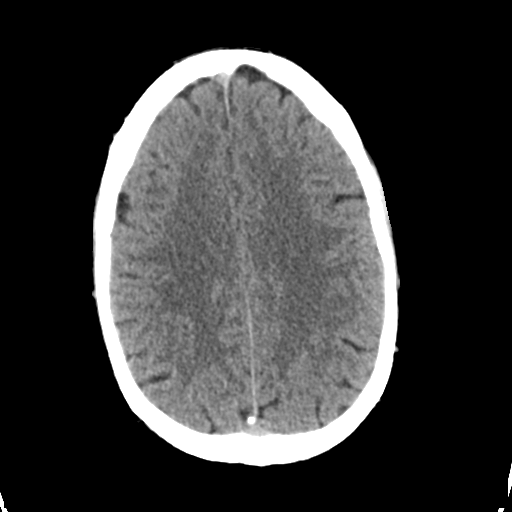
[im 20/32  bone]
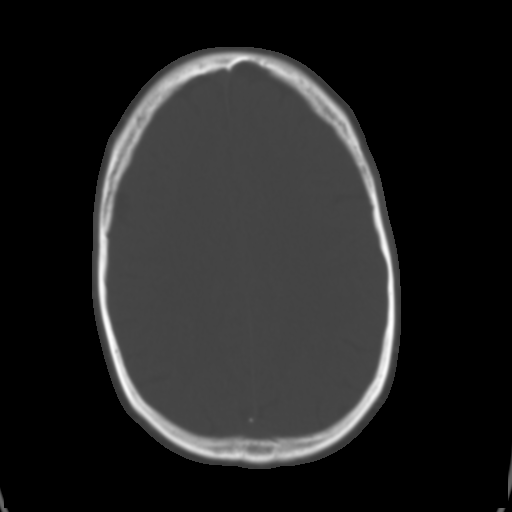
[im 24/32  brain]
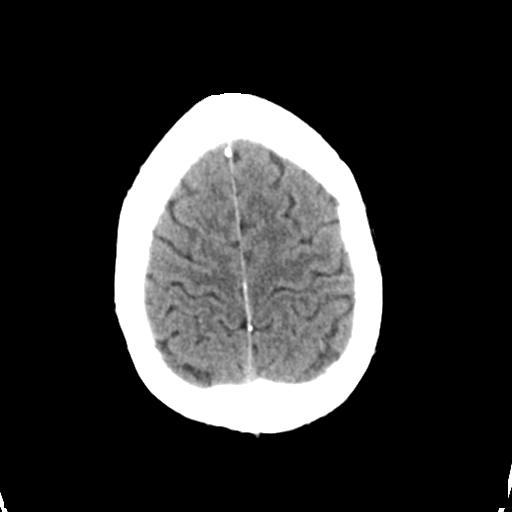
[im 28/32  brain]
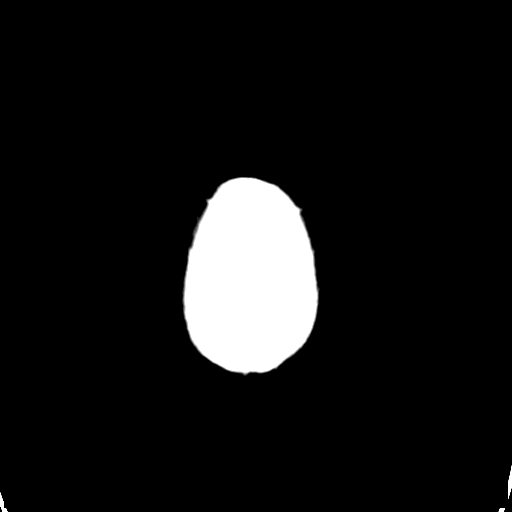

[Series 4: head bone · axial · 0.42mm/px · z∈[-78,-46]mm · 3 of 80 slices shown]
[im 8/80  bone]
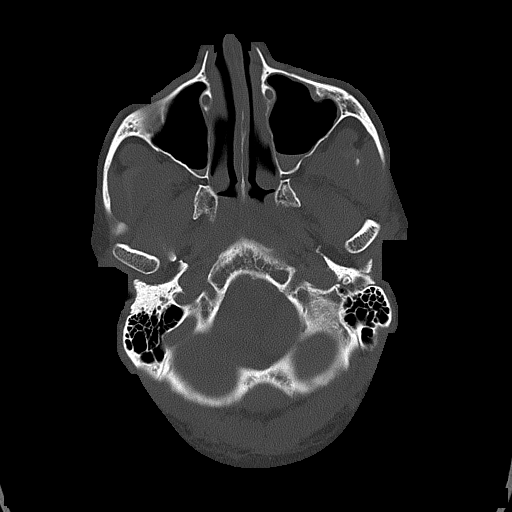
[im 16/80  bone]
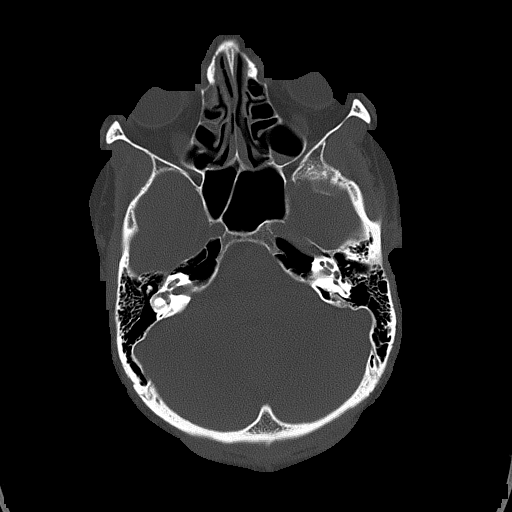
[im 24/80  bone]
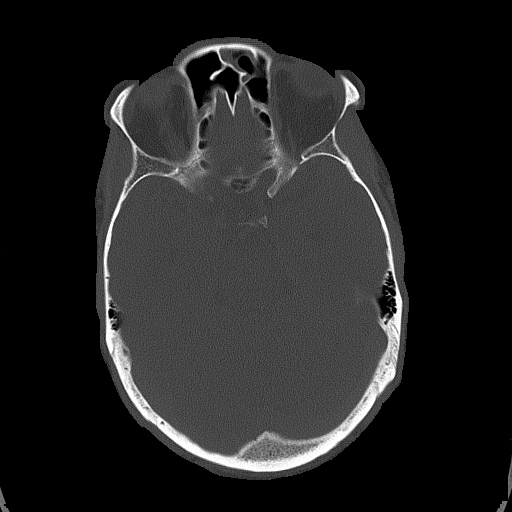

[Series 5: head without cor · coronal · non-contrast · 0.31mm/px · 3 of 71 slices shown]
[im 24/71  brain]
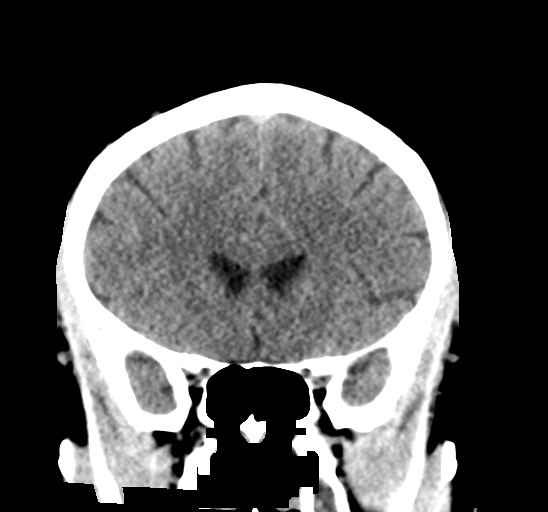
[im 32/71  brain]
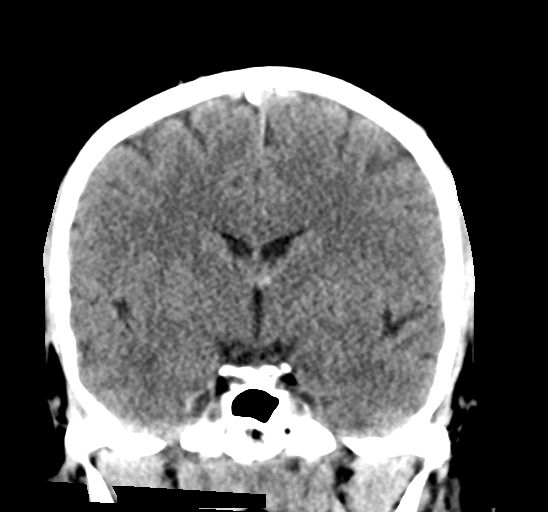
[im 39/71  brain]
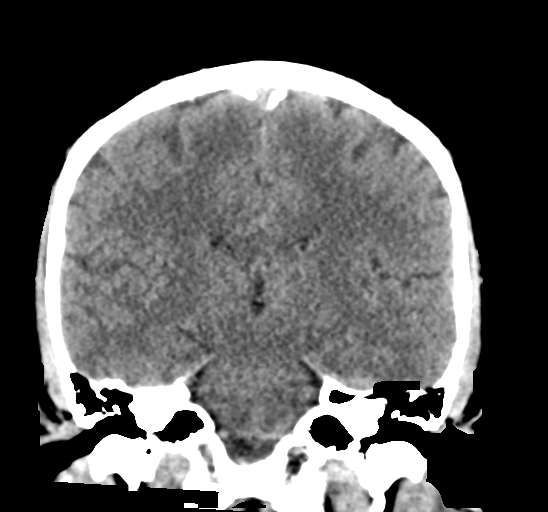

[Series 6: head without sag · sagittal · non-contrast · 0.31mm/px · 3 of 52 slices shown]
[im 18/52  brain]
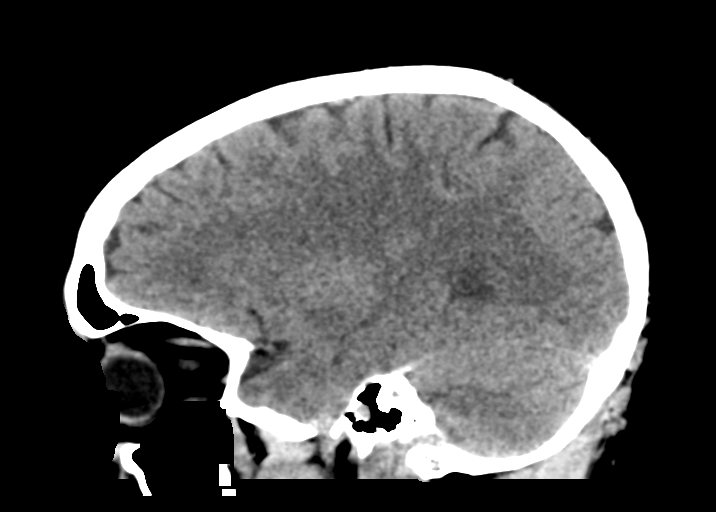
[im 26/52  brain]
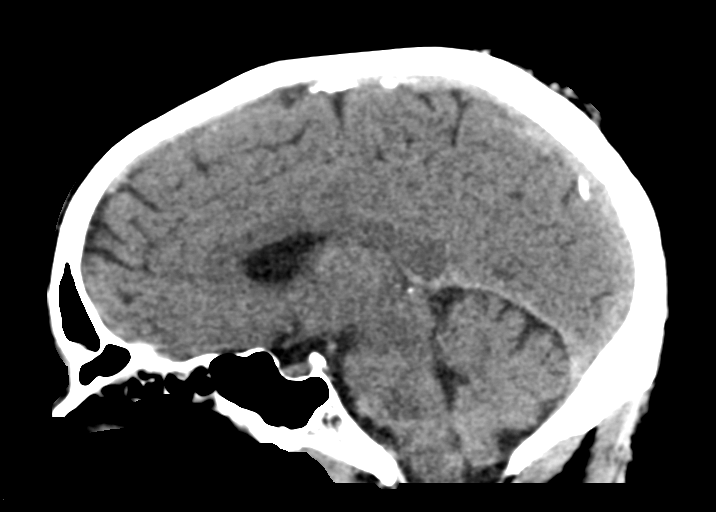
[im 35/52  brain]
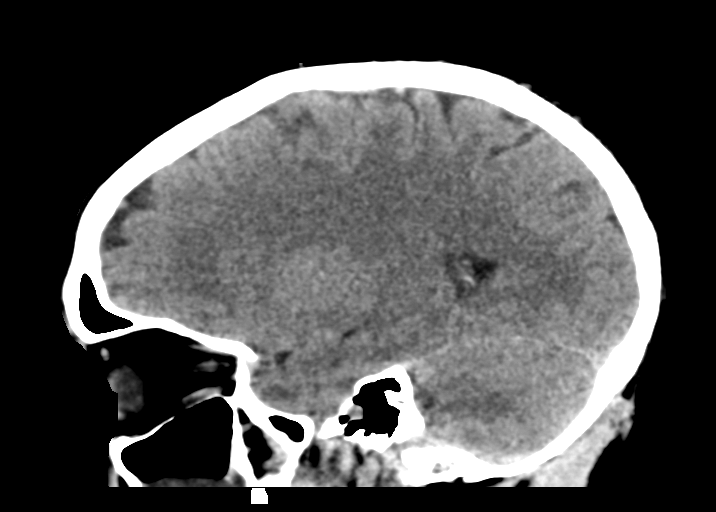

[16 of 47 positions shown; findings below may reference images not displayed]

FINDINGS: Brain: Decreasing intraventricular hemorrhage at the level of the
foramen of [place]. No infarct or hydrocephalus. No collection or
masslike finding.

Vascular: CTA performed yesterday.

Skull: Normal. Negative for fracture or focal lesion.

Sinuses/Orbits: Small high-density fluid levels in the maxillary
sinuses. Mucosal thickening in the inferior left maxillary sinus.
IMPRESSION: Mild decrease in the small volume hemorrhage about the foramen of
[place]un[place]. No hydrocephalus.
